# Patient Record
Sex: Male | Born: 2000 | Race: Black or African American | Hispanic: No | Marital: Single | Smoking: Never smoker
Health system: Southern US, Community
[De-identification: ages and names within clinical notes are randomized; demographics above are authoritative.]

## PROBLEM LIST (undated history)

## (undated) DIAGNOSIS — Z789 Other specified health status: Secondary | ICD-10-CM

## (undated) DIAGNOSIS — S02610A Fracture of condylar process of mandible, unspecified side, initial encounter for closed fracture: Secondary | ICD-10-CM

---
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
## 2019-04-23 ENCOUNTER — Other Ambulatory Visit: Payer: Self-pay

## 2019-04-23 ENCOUNTER — Encounter (HOSPITAL_COMMUNITY): Payer: Self-pay

## 2019-04-23 ENCOUNTER — Ambulatory Visit (HOSPITAL_COMMUNITY)
Admission: EM | Admit: 2019-04-23 | Discharge: 2019-04-23 | Disposition: A | Discharge status: Home / Self Care | Payer: Medicaid Other | Attending: Family Medicine | Admitting: Family Medicine

## 2019-04-23 DIAGNOSIS — M79675 Pain in left toe(s): Secondary | ICD-10-CM

## 2019-04-23 MED ORDER — IBUPROFEN 600 MG PO TABS: 600.0000 mg | ORAL_TABLET | Freq: Four times a day (QID) | ORAL | 0 refills | Status: DC | PRN

## 2019-04-23 NOTE — Discharge Instructions (Addendum)
This is most likely a sprain of the toe or just pain from the shoes you are wearing and walking. We will do ibuprofen every 6 hours, 600 mg. Rest, ice and elevate the foot. Wear the shoe for comfort when walking Follow up as needed for continued or worsening symptoms

## 2019-04-23 NOTE — ED Triage Notes (Signed)
Pt states he is having left foot pain and left pinky toe pain x 1 week. Pt thinks is relate to all the hours he is standing up and walking at his job at [employer].

## 2019-04-24 NOTE — ED Provider Notes (Signed)
[facility]    CSN: [id number] Arrival date & time: 04/23/19  1429      History   Chief Complaint Chief Complaint  Patient presents with  . Foot Pain  . Toe Pain    HPI Victor Rogers is a 18 y.o. male.   Patient is an 18 year old male presents today with left fifth toe pain, mild swelling x1 week.  Symptoms have been constant and remain the same.  He reports a lot of walking and standing at work.  He feels as if his shoes are not supportive.  No injuries to the foot.  No redness, increased warmth, fevers or history of gout.  He has not tried anything for his symptoms.  ROS per HPI    Foot Pain  Toe Pain    Past Medical History:  Diagnosis Date  . 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 Medical history non-contributory     There are no active problems to display for this patient.   Past Surgical History:  Procedure Laterality Date  . MANDIBULAR HARDWARE REMOVAL Bilateral 07/13/2017   Procedure: MANDIBULAR HARDWARE REMOVAL;  Surgeon: Peggye Form, DO;  Location: [facility];  Service: Plastics;  Laterality: Bilateral;  . ORIF MANDIBULAR FRACTURE Bilateral 05/25/2017   Procedure: OPEN REDUCTION INTERNAL FIXATION (ORIF) BILATERAL  MANDIBULAR FRACTURE;  Surgeon: Peggye Form, DO;  Location: [facility];  Service: Plastics;  Laterality: Bilateral;       Home Medications    Prior to Admission medications   Medication Sig Start Date End Date Taking? Authorizing Provider  ibuprofen (ADVIL) 600 MG tablet Take 1 tablet (600 mg total) by mouth every 6 (six) hours as needed. 04/23/19   Janace Aris, NP    Family History Family History  Problem Relation Age of Onset  . Hypertension Maternal Grandmother   . Stroke Maternal Grandfather     Social History Social History   Tobacco Use  . Smoking status: Passive Smoke Exposure - Never Smoker  . Smokeless tobacco: Never Used   . Tobacco comment: mother smokes outside  Substance Use Topics  . Alcohol use: No  . Drug use: No     Allergies   Patient has no known allergies.   Review of Systems Review of Systems   Physical Exam Triage Vital Signs ED Triage Vitals  Enc Vitals Group     BP 04/23/19 1509 130/73     Pulse Rate 04/23/19 1509 71     Resp 04/23/19 1509 14     Temp 04/23/19 1509 98.6 F (37 C)     Temp Source 04/23/19 1509 Oral     SpO2 04/23/19 1509 99 %     Weight --      Height --      Head Circumference --      Peak Flow --      Pain Score 04/23/19 1507 9     Pain Loc --      Pain Edu? --      Excl. in GC? --    No data found.  Updated Vital Signs BP 130/73 (BP Location: Right Arm)   Pulse 71   Temp 98.6 F (37 C) (Oral)   Resp 14   SpO2 99%   Visual Acuity Right Eye Distance:   Left Eye Distance:   Bilateral Distance:    Right Eye Near:   Left Eye Near:    Bilateral Near:  Physical Exam Vitals signs and nursing note reviewed.  Constitutional:      Appearance: Normal appearance.  HENT:     Head: Normocephalic and atraumatic.     Nose: Nose normal.  Eyes:     Conjunctiva/sclera: Conjunctivae normal.  Neck:     Musculoskeletal: Normal range of motion.  Pulmonary:     Effort: Pulmonary effort is normal.  Musculoskeletal: Normal range of motion.     Comments: Mild swelling and tenderness to left fifth toe.  No bruising, deformities.  No increased warmth or redness.  Strong pedal pulse  Skin:    General: Skin is warm and dry.  Neurological:     Mental Status: He is alert.  Psychiatric:        Mood and Affect: Mood normal.      UC Treatments / Results  Labs (all labs ordered are listed, but only abnormal results are displayed) Labs Reviewed - No data to display  EKG   Radiology No results found.  Procedures Procedures (including critical care time)  Medications Ordered in UC Medications - No data to display  Initial Impression /  Assessment and Plan / UC Course  I have reviewed the triage vital signs and the nursing notes.  Pertinent labs & imaging results that were available during my care of the patient were reviewed by me and considered in my medical decision making (see chart for details).     Toe pain-most likely from increased walking, standing and nonsupportive shoes. We will have him rest, ice, elevate the foot and take ibuprofen every 6 hours Follow up as needed for continued or worsening symptoms Recommended [place] with supportive shoes. Final Clinical Impressions(s) / UC Diagnoses   Final diagnoses:  Toe pain, left     Discharge Instructions     This is most likely a sprain of the toe or just pain from the shoes you are wearing and walking. We will do ibuprofen every 6 hours, 600 mg. Rest, ice and elevate the foot. Wear the shoe for comfort when walking Follow up as needed for continued or worsening symptoms    ED Prescriptions    Medication Sig Dispense Auth. Provider   ibuprofen (ADVIL) 600 MG tablet Take 1 tablet (600 mg total) by mouth every 6 (six) hours as needed. 30 tablet Loura Halt A, NP     PDMP not reviewed this encounter.   Orvan July, NP 04/24/19 [phone]

## 2020-07-01 ENCOUNTER — Emergency Department (HOSPITAL_COMMUNITY)
Admission: EM | Admit: 2020-07-01 | Discharge: 2020-07-01 | Discharge status: Absent without leave | Payer: Medicaid Other

## 2020-07-15 ENCOUNTER — Other Ambulatory Visit: Payer: Medicaid Other

## 2020-07-16 ENCOUNTER — Other Ambulatory Visit: Payer: Self-pay

## 2020-07-16 DIAGNOSIS — Z20822 Contact with and (suspected) exposure to covid-19: Secondary | ICD-10-CM

## 2020-07-18 LAB — SARS-COV-2, NAA 2 DAY TAT

## 2020-07-18 LAB — NOVEL CORONAVIRUS, NAA: SARS-CoV-2, NAA: NOT DETECTED

## 2020-07-21 ENCOUNTER — Encounter (HOSPITAL_COMMUNITY): Payer: Self-pay

## 2021-01-22 ENCOUNTER — Encounter: Payer: Self-pay | Admitting: Emergency Medicine

## 2021-01-22 ENCOUNTER — Ambulatory Visit
Admission: EM | Admit: 2021-01-22 | Discharge: 2021-01-22 | Disposition: A | Discharge status: Home / Self Care | Payer: Medicaid Other | Attending: Physician Assistant | Admitting: Physician Assistant

## 2021-01-22 DIAGNOSIS — J029 Acute pharyngitis, unspecified: Secondary | ICD-10-CM

## 2021-01-22 DIAGNOSIS — J039 Acute tonsillitis, unspecified: Secondary | ICD-10-CM | POA: Insufficient documentation

## 2021-01-22 LAB — POCT RAPID STREP A (OFFICE): Rapid Strep A Screen: NEGATIVE

## 2021-01-22 LAB — POCT MONO SCREEN (KUC): Mono, POC: NEGATIVE

## 2021-01-22 MED ORDER — CEFTRIAXONE SODIUM 1 G IJ SOLR
1.0000 g | Freq: Once | INTRAMUSCULAR | Status: AC
  Administered 2021-01-22: 1 g via INTRAMUSCULAR

## 2021-01-22 MED ORDER — AMOXICILLIN-POT CLAVULANATE 875-125 MG PO TABS: 1.0000 | ORAL_TABLET | Freq: Two times a day (BID) | ORAL | 0 refills | Status: AC

## 2021-01-22 NOTE — Discharge Instructions (Addendum)
Your strep and mono testing was negative.  We gave you an injection of antibiotics today to help speed up your recovery and I would like you to start Augmentin twice daily for 10 days.  You can use over-the-counter medications for symptom relief such as Tylenol and ibuprofen.  If you have any increased throat swelling, difficulty swallowing, changes in your voice, shortness of breath, high fever you need to be reevaluated in the emergency room.  Please follow-up with either our clinic or your primary care provider within 1 week to ensure symptom improvement.

## 2021-01-22 NOTE — ED Triage Notes (Signed)
Hard to swallow, left tonsil pain, x 3 days. Very swollen on exam, 4/4. Denies right tonsil pain, no swelling visible. Grey exudate on left tonsil

## 2021-01-22 NOTE — ED Provider Notes (Signed)
[facility]    CSN: [id number] Arrival date & time: 01/22/21  1150      History   Chief Complaint Chief Complaint  Patient presents with   Sore Throat    HPI Victor Rogers is a 20 y.o. male.   Patient presents today with a 3-day history of severe sore throat that is worse on the left side.  He reports pain is rated 7 on a 0-10 pain scale, worse with swallowing, no alleviating factors identified.  Denies additional symptoms including fever, cough, congestion, nausea, vomiting, headache, dizziness.  Denies any difficulty swallowing, shortness of breath, muffled voice.  Has not tried any over-the-counter medication for symptom management.  Denies any recent antibiotic use.  Denies any known sick contacts.  He is able to eat and drink despite symptoms.   Past Medical History:  Diagnosis Date   Condylar process of mandible, closed fracture [facility]) 11/5 2018   right   Medical history non-contributory     There are no problems to display for this patient.   Past Surgical History:  Procedure Laterality Date   MANDIBULAR HARDWARE REMOVAL Bilateral 07/13/2017   Procedure: MANDIBULAR HARDWARE REMOVAL;  Surgeon: Peggye Form, DO;  Location: [facility];  Service: Plastics;  Laterality: Bilateral;   ORIF MANDIBULAR FRACTURE Bilateral 05/25/2017   Procedure: OPEN REDUCTION INTERNAL FIXATION (ORIF) BILATERAL  MANDIBULAR FRACTURE;  Surgeon: Peggye Form, DO;  Location: [facility];  Service: Plastics;  Laterality: Bilateral;       Home Medications    Prior to Admission medications   Medication Sig Start Date End Date Taking? Authorizing Provider  amoxicillin-clavulanate (AUGMENTIN) 875-125 MG tablet Take 1 tablet by mouth every 12 (twelve) hours for 10 days. 01/22/21 02/01/21 Yes Rogena Deupree K, PA-C  ibuprofen (ADVIL) 600 MG tablet Take 1 tablet (600 mg total) by mouth every 6 (six) hours as needed. 04/23/19   Janace Aris, NP     Family History Family History  Problem Relation Age of Onset   Hypertension Maternal Grandmother    Stroke Maternal Grandfather     Social History Social History   Tobacco Use   Smoking status: Passive Smoke Exposure - Never Smoker   Smokeless tobacco: Never   Tobacco comments:    mother smokes outside  Vaping Use   Vaping Use: Never used  Substance Use Topics   Alcohol use: No   Drug use: No     Allergies   Patient has no known allergies.   Review of Systems Review of Systems  Constitutional:  Negative for activity change, appetite change, fatigue and fever.  HENT:  Positive for sore throat. Negative for congestion, sinus pressure, sneezing, trouble swallowing and voice change.   Respiratory:  Negative for cough and shortness of breath.   Cardiovascular:  Negative for chest pain.  Gastrointestinal:  Negative for abdominal pain, diarrhea, nausea and vomiting.  Neurological:  Negative for dizziness, light-headedness and headaches.    Physical Exam Triage Vital Signs ED Triage Vitals  Enc Vitals Group     BP 01/22/21 1158 119/61     Pulse Rate 01/22/21 1158 82     Resp 01/22/21 1158 14     Temp 01/22/21 1158 97.9 F (36.6 C)     Temp Source 01/22/21 1158 Oral     SpO2 01/22/21 1158 98 %     Weight --      Height --      Head Circumference --  Peak Flow --      Pain Score 01/22/21 1201 7     Pain Loc --      Pain Edu? --      Excl. in GC? --    No data found.  Updated Vital Signs BP 119/61 (BP Location: Left Arm)   Pulse 82   Temp 97.9 F (36.6 C) (Oral)   Resp 14   SpO2 98%   Visual Acuity Right Eye Distance:   Left Eye Distance:   Bilateral Distance:    Right Eye Near:   Left Eye Near:    Bilateral Near:     Physical Exam Vitals reviewed.  Constitutional:      General: He is awake.     Appearance: Normal appearance. He is normal weight. He is not ill-appearing.     Comments: Very pleasant male appears stated age in no acute  distress  HENT:     Head: Normocephalic and atraumatic.     Right Ear: External ear normal.     Left Ear: External ear normal.     Nose: Nose normal.     Mouth/Throat:     Pharynx: Uvula midline. Posterior oropharyngeal erythema present. No oropharyngeal exudate.     Tonsils: Tonsillar exudate present. No tonsillar abscesses. 2+ on the right. 2+ on the left.  Cardiovascular:     Rate and Rhythm: Normal rate and regular rhythm.     Heart sounds: Normal heart sounds, S1 normal and S2 normal. No murmur heard. Pulmonary:     Effort: Pulmonary effort is normal. No accessory muscle usage or respiratory distress.     Breath sounds: Normal breath sounds. No stridor. No wheezing, rhonchi or rales.     Comments: Clear to auscultation bilaterally Abdominal:     General: Bowel sounds are normal.     Palpations: Abdomen is soft.     Tenderness: There is no abdominal tenderness.  Lymphadenopathy:     Head:     Right side of head: No submental, submandibular or tonsillar adenopathy.     Left side of head: No submental, submandibular or tonsillar adenopathy.     Cervical: No cervical adenopathy.  Neurological:     Mental Status: He is alert.  Psychiatric:        Behavior: Behavior is cooperative.     UC Treatments / Results  Labs (all labs ordered are listed, but only abnormal results are displayed) Labs Reviewed  POCT RAPID STREP A (OFFICE) - Normal  CULTURE, GROUP A STREP [facility])  POCT MONO SCREEN [facility])    EKG   Radiology No results found.  Procedures Procedures (including critical care time)  Medications Ordered in UC Medications  cefTRIAXone (ROCEPHIN) injection 1 g (has no administration in time range)    Initial Impression / Assessment and Plan / UC Course  I have reviewed the triage vital signs and the nursing notes.  Pertinent labs & imaging results that were available during my care of the patient were reviewed by me and considered in my medical decision making (see  chart for details).      Vital signs and physical exam reassuring today; no indication for emergent evaluation.  Concern for beginning of peritonsillar abscess given clinical presentation.  Patient was given 1 g of Rocephin in clinic and started on Augmentin outpatient.  Discussed that if he has any worsening symptoms including fever, difficulty swallowing, difficulty speaking, shortness of breath he needs to go to the emergency room. Recommended he follow-up with  ear nose and throat doctor for further evaluation and management was given contact information for local group.  Discussed at length alarm symptoms that warrant emergent evaluation to which patient expressed understanding.  Strict return precautions given to which patient expressed understanding.  Final Clinical Impressions(s) / UC Diagnoses   Final diagnoses:  Acute tonsillitis, unspecified etiology  Sore throat     Discharge Instructions      Your strep and mono testing was negative.  We gave you an injection of antibiotics today to help speed up your recovery and I would like you to start Augmentin twice daily for 10 days.  You can use over-the-counter medications for symptom relief such as Tylenol and ibuprofen.  If you have any increased throat swelling, difficulty swallowing, changes in your voice, shortness of breath, high fever you need to be reevaluated in the emergency room.  Please follow-up with either our clinic or your primary care provider within 1 week to ensure symptom improvement.     ED Prescriptions     Medication Sig Dispense Auth. Provider   amoxicillin-clavulanate (AUGMENTIN) 875-125 MG tablet Take 1 tablet by mouth every 12 (twelve) hours for 10 days. 20 tablet Ixchel Duck, Noberto Retort, PA-C      PDMP not reviewed this encounter.   Jeani Hawking, PA-C 01/22/21 1301

## 2021-01-25 LAB — CULTURE, GROUP A STREP (THRC)

## 2021-05-08 ENCOUNTER — Ambulatory Visit
Admission: EM | Admit: 2021-05-08 | Discharge: 2021-05-08 | Disposition: A | Discharge status: Home / Self Care | Payer: Medicaid Other | Attending: Emergency Medicine | Admitting: Emergency Medicine

## 2021-05-08 ENCOUNTER — Other Ambulatory Visit: Payer: Self-pay

## 2021-05-08 DIAGNOSIS — J101 Influenza due to other identified influenza virus with other respiratory manifestations: Secondary | ICD-10-CM | POA: Diagnosis not present

## 2021-05-08 DIAGNOSIS — B349 Viral infection, unspecified: Secondary | ICD-10-CM

## 2021-05-08 LAB — POCT INFLUENZA A/B
Influenza A, POC: POSITIVE — AB
Influenza B, POC: NEGATIVE

## 2021-05-08 MED ORDER — IBUPROFEN 600 MG PO TABS
600.0000 mg | ORAL_TABLET | Freq: Four times a day (QID) | ORAL | 0 refills | Status: AC | PRN
Start: 2021-05-08 — End: ?

## 2021-05-08 NOTE — ED Provider Notes (Signed)
[facility]    CSN: [id number] Arrival date & time: 05/08/21  1112      History   Chief Complaint No chief complaint on file.   HPI Victor Rogers is a 20 y.o. male.   Patient presents to the urgent care today complaining of fever, chills, sore throat and headache, patient is requesting testing for COVID and flu today.  Patient states his symptoms started began yesterday.  Patient states has been taking ibuprofen for his symptoms with some relief.  Vital signs are normal on arrival today.  From observation, patient does not appear ill.  Patient states that he had his influenza vaccine 4 weeks ago.  The history is provided by the patient.   Past Medical History:  Diagnosis Date   Condylar process of mandible, closed fracture [facility]) 11/5 2018   right   Medical history non-contributory     There are no problems to display for this patient.   Past Surgical History:  Procedure Laterality Date   MANDIBULAR HARDWARE REMOVAL Bilateral 07/13/2017   Procedure: MANDIBULAR HARDWARE REMOVAL;  Surgeon: Peggye Form, DO;  Location: [facility];  Service: Plastics;  Laterality: Bilateral;   ORIF MANDIBULAR FRACTURE Bilateral 05/25/2017   Procedure: OPEN REDUCTION INTERNAL FIXATION (ORIF) BILATERAL  MANDIBULAR FRACTURE;  Surgeon: Peggye Form, DO;  Location: [facility];  Service: Plastics;  Laterality: Bilateral;       Home Medications    Prior to Admission medications   Medication Sig Start Date End Date Taking? Authorizing Provider  ibuprofen (ADVIL) 600 MG tablet Take 1 tablet (600 mg total) by mouth every 6 (six) hours as needed. 05/08/21   Theadora Rama Scales, PA-C    Family History Family History  Problem Relation Age of Onset   Hypertension Maternal Grandmother    Stroke Maternal Grandfather     Social History Social History   Tobacco Use   Smoking status: Passive Smoke Exposure - Never Smoker   Smokeless  tobacco: Never   Tobacco comments:    mother smokes outside  Vaping Use   Vaping Use: Never used  Substance Use Topics   Alcohol use: No   Drug use: No     Allergies   Patient has no known allergies.   Review of Systems Review of Systems Pertinent findings noted in history of present illness.    Physical Exam Triage Vital Signs ED Triage Vitals  Enc Vitals Group     BP 05/01/21 0827 (!) 147/82     Pulse Rate 05/01/21 0827 72     Resp 05/01/21 0827 18     Temp 05/01/21 0827 98.3 F (36.8 C)     Temp Source 05/01/21 0827 Oral     SpO2 05/01/21 0827 98 %     Weight --      Height --      Head Circumference --      Peak Flow --      Pain Score 05/01/21 0826 5     Pain Loc --      Pain Edu? --      Excl. in GC? --    No data found.  Updated Vital Signs BP 131/87 (BP Location: Right Arm)   Pulse 67   Temp 98.4 F (36.9 C) (Oral)   Resp 20   SpO2 98%   Visual Acuity Right Eye Distance:   Left Eye Distance:   Bilateral Distance:    Right Eye Near:   Left  Eye Near:    Bilateral Near:     Physical Exam Vitals and nursing note reviewed.  Constitutional:      General: He is not in acute distress.    Appearance: Normal appearance. He is not ill-appearing.  HENT:     Head: Normocephalic and atraumatic.     Salivary Glands: Right salivary gland is not diffusely enlarged or tender. Left salivary gland is not diffusely enlarged or tender.     Right Ear: Tympanic membrane, ear canal and external ear normal. No drainage. No middle ear effusion. There is no impacted cerumen. Tympanic membrane is not erythematous or bulging.     Left Ear: Tympanic membrane, ear canal and external ear normal. No drainage.  No middle ear effusion. There is no impacted cerumen. Tympanic membrane is not erythematous or bulging.     Nose: Nose normal. No nasal deformity, septal deviation, mucosal edema, congestion or rhinorrhea.     Right Turbinates: Not enlarged, swollen or pale.      Left Turbinates: Not enlarged, swollen or pale.     Right Sinus: No maxillary sinus tenderness or frontal sinus tenderness.     Left Sinus: No maxillary sinus tenderness or frontal sinus tenderness.     Mouth/Throat:     Lips: Pink. No lesions.     Mouth: Mucous membranes are moist. No oral lesions.     Pharynx: Oropharynx is clear. Uvula midline. No posterior oropharyngeal erythema or uvula swelling.     Tonsils: No tonsillar exudate. 0 on the right. 0 on the left.  Eyes:     General: Lids are normal.        Right eye: No discharge.        Left eye: No discharge.     Extraocular Movements: Extraocular movements intact.     Conjunctiva/sclera: Conjunctivae normal.     Right eye: Right conjunctiva is not injected.     Left eye: Left conjunctiva is not injected.  Neck:     Trachea: Trachea and phonation normal.  Cardiovascular:     Rate and Rhythm: Normal rate and regular rhythm.     Pulses: Normal pulses.     Heart sounds: Normal heart sounds. No murmur heard.   No friction rub. No gallop.  Pulmonary:     Effort: Pulmonary effort is normal. No accessory muscle usage, prolonged expiration or respiratory distress.     Breath sounds: Normal breath sounds. No stridor, decreased air movement or transmitted upper airway sounds. No decreased breath sounds, wheezing, rhonchi or rales.  Chest:     Chest wall: No tenderness.  Musculoskeletal:        General: Normal range of motion.     Cervical back: Normal range of motion and neck supple. Normal range of motion.  Lymphadenopathy:     Cervical: No cervical adenopathy.  Skin:    General: Skin is warm and dry.     Findings: No erythema or rash.  Neurological:     General: No focal deficit present.     Mental Status: He is alert and oriented to person, place, and time.  Psychiatric:        Mood and Affect: Mood normal.        Behavior: Behavior normal.     UC Treatments / Results  Labs (all labs ordered are listed, but only abnormal  results are displayed) Labs Reviewed  POCT INFLUENZA A/B - Abnormal; Notable for the following components:      Result Value  Influenza A, POC Positive (*)    All other components within normal limits    EKG   Radiology No results found.  Procedures Procedures (including critical care time)  Medications Ordered in UC Medications - No data to display  Initial Impression / Assessment and Plan / UC Course  I have reviewed the triage vital signs and the nursing notes.  Pertinent labs & imaging results that were available during my care of the patient were reviewed by me and considered in my medical decision making (see chart for details).     Physical exam today is unremarkable, patient is well-appearing.  Patient tested positive for influenza A, conservative care recommended, return precautions advised.  Patient verbalized understanding and agreement of plan as discussed.  All questions were addressed during visit.  Please see discharge instructions below for further details of plan.  Final Clinical Impressions(s) / UC Diagnoses   Final diagnoses:  Viral illness  Influenza A     Discharge Instructions      Your influenza test today was positive for influenza A.  Testing for COVID-19 is not indicated.  Conservative care is recommended at this time.  This includes rest, pushing clear fluids and activity as tolerated.  You may also noticed that your appetite is reduced, this is okay as long as you are drinking plenty of clear fluids.  Acetaminophen (Tylenol): This is a good fever reducer.  If your body temperature rises above 101.5 as measured with a thermometer, it is recommended that you take 1,000 mg every 6-8 hours until your temperature falls below 101.5, please not take more than 3,000 mg of acetaminophen either as a separate medication or as in ingredient in an over-the-counter cold/flu preparation within a 24-hour period  Ibuprofen  (Advil, Motrin): This is a good  anti-inflammatory medication which addresses aches and pains and, to some degree, congestion in the nasal passages.  I recommend taking between 400 to 600 mg every 6-8 hours as needed.  Pseudoephedrine (Sudafed): This is a decongestant.  This medication has to be purchased from the pharmacist counter, I recommend taking 2 tablets, 60 mg, 2-3 times a day as needed to relieve runny nose and sinus drainage.  Guaifenesin (Robitussin, Mucinex): This is an expectorant.  This helps break up chest congestion and loosen up thick nasal drainage making phlegm and drainage more liquid and therefore easier to remove.  I recommend taking 400 mg three times daily as needed.  Dextromethorphan (any cough medicine with the letters "DM" added to it's name such as Robitussin DM): This is a cough suppressant.  This is often recommended to be taken at nighttime to suppress cough and help people sleep.  Take as directed on the bottle.   Based on my physical exam findings and the history you provided  today, I do not see any evidence of bacterial infection therefore treatment with antibiotics would be of no benefit.      ED Prescriptions     Medication Sig Dispense Auth. Provider   ibuprofen (ADVIL) 600 MG tablet Take 1 tablet (600 mg total) by mouth every 6 (six) hours as needed. 30 tablet Theadora Rama Scales, PA-C      PDMP not reviewed this encounter.    Theadora Rama Scales, [place] 05/08/21 1232

## 2021-05-08 NOTE — ED Triage Notes (Signed)
Pt reports having fever, chills, sore throat, and headache. Patient requesting COVID and flu testing.  Started: yesterday

## 2021-05-08 NOTE — Discharge Instructions (Addendum)
Your influenza test today was positive for influenza A.  Testing for COVID-19 is not indicated.  Conservative care is recommended at this time.  This includes rest, pushing clear fluids and activity as tolerated.  You may also noticed that your appetite is reduced, this is okay as long as you are drinking plenty of clear fluids.  Acetaminophen (Tylenol): This is a good fever reducer.  If your body temperature rises above 101.5 as measured with a thermometer, it is recommended that you take 1,000 mg every 6-8 hours until your temperature falls below 101.5, please not take more than 3,000 mg of acetaminophen either as a separate medication or as in ingredient in an over-the-counter cold/flu preparation within a 24-hour period  Ibuprofen  (Advil, Motrin): This is a good anti-inflammatory medication which addresses aches and pains and, to some degree, congestion in the nasal passages.  I recommend taking between 400 to 600 mg every 6-8 hours as needed.  Pseudoephedrine (Sudafed): This is a decongestant.  This medication has to be purchased from the pharmacist counter, I recommend taking 2 tablets, 60 mg, 2-3 times a day as needed to relieve runny nose and sinus drainage.  Guaifenesin (Robitussin, Mucinex): This is an expectorant.  This helps break up chest congestion and loosen up thick nasal drainage making phlegm and drainage more liquid and therefore easier to remove.  I recommend taking 400 mg three times daily as needed.  Dextromethorphan (any cough medicine with the letters "DM" added to it's name such as Robitussin DM): This is a cough suppressant.  This is often recommended to be taken at nighttime to suppress cough and help people sleep.  Take as directed on the bottle.   Based on my physical exam findings and the history you provided  today, I do not see any evidence of bacterial infection therefore treatment with antibiotics would be of no benefit.

## 2021-11-09 ENCOUNTER — Ambulatory Visit (HOSPITAL_COMMUNITY)
Admission: EM | Admit: 2021-11-09 | Discharge: 2021-11-09 | Disposition: A | Discharge status: Home / Self Care | Payer: Medicaid Other | Attending: Internal Medicine | Admitting: Internal Medicine

## 2021-11-09 ENCOUNTER — Encounter (HOSPITAL_COMMUNITY): Payer: Self-pay | Admitting: Emergency Medicine

## 2021-11-09 DIAGNOSIS — J039 Acute tonsillitis, unspecified: Secondary | ICD-10-CM | POA: Insufficient documentation

## 2021-11-09 LAB — POCT INFECTIOUS MONO SCREEN, ED / UC: Mono Screen: NEGATIVE

## 2021-11-09 LAB — POCT RAPID STREP A, ED / UC: Streptococcus, Group A Screen (Direct): NEGATIVE

## 2021-11-09 MED ORDER — CEFTRIAXONE SODIUM 500 MG IJ SOLR
500.0000 mg | Freq: Once | INTRAMUSCULAR | Status: AC
  Administered 2021-11-09: 500 mg via INTRAMUSCULAR

## 2021-11-09 MED ORDER — CHLORHEXIDINE GLUCONATE 0.12 % MT SOLN: 15.0000 mL | Freq: Two times a day (BID) | OROMUCOSAL | 0 refills | Status: AC

## 2021-11-09 MED ORDER — CEFTRIAXONE SODIUM 500 MG IJ SOLR
INTRAMUSCULAR | Status: AC
  Filled 2021-11-09: qty 500

## 2021-11-09 NOTE — ED Triage Notes (Signed)
Sore throat x 2 days. Oral lesion x 1 week.  ?

## 2021-11-09 NOTE — Discharge Instructions (Addendum)
Warm salt water gargle ?Chlorhexidine mouthwash to gargle as needed ?We will call you with recommendations if labs are abnormal ? ?

## 2021-11-09 NOTE — ED Provider Notes (Signed)
?[facility] ? ? ? ?CSN: [id number] ?Arrival date & time: 11/09/21  1333 ? ? ?  ? ?History   ?Chief Complaint ?Chief Complaint  ?Patient presents with  ? Sore Throat  ? Mouth Lesions  ? ? ?HPI ?Victor Rogers is a 21 y.o. male comes to the urgent care with 2-day history of sore throat and a 1 week history of an oral ulcer.  Patient says symptoms started a week ago with the oral lesion.  The lesion is painful.  No runny nose or nasal congestion.  He started experiencing sore throat 2 days ago.  Sore throat is severe and aggravated by swallowing.  No nausea or vomiting.  No neck swelling or pain.  No abdominal pain.Patient engages in anal receptive intercourse with a male partner. He also endorses oral sex occasionally.  No penile discharge or testicular pain.  No groin pain or swelling.  No rash..  ? ?HPI ? ?Past Medical History:  ?Diagnosis Date  ? 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 right  ? Medical history non-contributory   ? ? ?There are no problems to display for this patient. ? ? ?Past Surgical History:  ?Procedure Laterality Date  ? MANDIBULAR HARDWARE REMOVAL Bilateral 07/13/2017  ? Procedure: MANDIBULAR HARDWARE REMOVAL;  Surgeon: Peggye Form, DO;  Location: [facility];  Service: Plastics;  Laterality: Bilateral;  ? ORIF MANDIBULAR FRACTURE Bilateral 05/25/2017  ? Procedure: OPEN REDUCTION INTERNAL FIXATION (ORIF) BILATERAL  MANDIBULAR FRACTURE;  Surgeon: Peggye Form, DO;  Location: [facility];  Service: Plastics;  Laterality: Bilateral;  ? ? ? ? ? ?Home Medications   ? ?Prior to Admission medications   ?Medication Sig Start Date End Date Taking? Authorizing Provider  ?chlorhexidine (PERIDEX) 0.12 % solution Use as directed 15 mLs in the mouth or throat 2 (two) times daily. 11/09/21  Yes Kailash Hinze, Britta Mccreedy, MD  ?ibuprofen (ADVIL) 600 MG tablet Take 1 tablet (600 mg total) by mouth every 6 (six) hours as needed. 05/08/21   Theadora Rama Scales, PA-C  ? ? ?Family History ?Family History  ?Problem Relation Age of Onset  ? Hypertension Maternal Grandmother   ? Stroke Maternal Grandfather   ? ? ?Social History ?Social History  ? ?Tobacco Use  ? Smoking status: Never  ?  Passive exposure: Yes  ? Smokeless tobacco: Never  ? Tobacco comments:  ?  mother smokes outside  ?Vaping Use  ? Vaping Use: Never used  ?Substance Use Topics  ? Alcohol use: No  ? Drug use: No  ? ? ? ?Allergies   ?Patient has no known allergies. ? ? ?Review of Systems ?Review of Systems ?As per HPI ? ?Physical Exam ?Triage Vital Signs ?ED Triage Vitals  ?Enc Vitals Group  ?   BP 11/09/21 1556 124/78  ?   Pulse Rate 11/09/21 1556 82  ?   Resp 11/09/21 1556 16  ?   Temp 11/09/21 1556 98.5 ?F (36.9 ?C)  ?   Temp Source 11/09/21 1556 Oral  ?   SpO2 11/09/21 1556 98 %  ?   Weight --   ?   Height --   ?   Head Circumference --   ?   Peak Flow --   ?   Pain Score 11/09/21 1558 10  ?   Pain Loc --   ?   Pain Edu? --   ?   Excl. in GC? --   ? ?No  data found. ? ?Updated Vital Signs ?BP 124/78 (BP Location: Left Arm)   Pulse 82   Temp 98.5 ?F (36.9 ?C) (Oral)   Resp 16   SpO2 98%  ? ?Visual Acuity ?Right Eye Distance:   ?Left Eye Distance:   ?Bilateral Distance:   ? ?Right Eye Near:   ?Left Eye Near:    ?Bilateral Near:    ? ?Physical Exam ?Vitals and nursing note reviewed.  ?Constitutional:   ?   General: He is not in acute distress. ?   Appearance: He is not ill-appearing.  ?HENT:  ?   Right Ear: Tympanic membrane normal.  ?   Left Ear: Tympanic membrane normal.  ?   Mouth/Throat:  ?   Tonsils: Tonsillar exudate present. 2+ on the right. 2+ on the left.  ?Eyes:  ?   Conjunctiva/sclera: Conjunctivae normal.  ?   Pupils: Pupils are equal, round, and reactive to light.  ?Cardiovascular:  ?   Rate and Rhythm: Normal rate and regular rhythm.  ?Pulmonary:  ?   Effort: Pulmonary effort is normal.  ?   Breath sounds: Normal breath sounds.  ?Musculoskeletal:  ?   Cervical back: Normal range  of motion.  ?Lymphadenopathy:  ?   Cervical: No cervical adenopathy.  ?Neurological:  ?   Mental Status: He is alert.  ? ? ? ?UC Treatments / Results  ?Labs ?(all labs ordered are listed, but only abnormal results are displayed) ?Labs Reviewed  ?POCT RAPID STREP A, ED / UC  ?POCT INFECTIOUS MONO SCREEN, ED / UC  ?CYTOLOGY, (ORAL, ANAL, URETHRAL) ANCILLARY ONLY  ? ? ?EKG ? ? ?Radiology ?No results found. ? ?Procedures ?Procedures (including critical care time) ? ?Medications Ordered in UC ?Medications - No data to display ? ?Initial Impression / Assessment and Plan / UC Course  ?I have reviewed the triage vital signs and the nursing notes. ? ?Pertinent labs & imaging results that were available during my care of the patient were reviewed by me and considered in my medical decision making (see chart for details). ? ?  ? ?1.  Acute exudative tonsillitis: ?POCT strep a test is negative ?Mono spot test is negative ?Cytology for GC/chlamydia-sample taken from the throat ?Ceftriaxone 500 mg IM x1 dose ?We will call patient with recommendations if labs are abnormal. ?Final Clinical Impressions(s) / UC Diagnoses  ? ?Final diagnoses:  ?Acute tonsillitis, unspecified etiology  ? ? ? ?Discharge Instructions   ? ?  ?Warm salt water gargle ?Chlorhexidine mouthwash to gargle as needed ?We will call you with recommendations if labs are abnormal ? ? ? ? ? ?ED Prescriptions   ? ? Medication Sig Dispense Auth. Provider  ? chlorhexidine (PERIDEX) 0.12 % solution Use as directed 15 mLs in the mouth or throat 2 (two) times daily. 120 mL Jmichael Gille, Britta Mccreedy, MD  ? ?  ? ?PDMP not reviewed this encounter. ?  ?Merrilee Jansky, MD ?11/09/21 1658 ? ?

## 2021-11-10 ENCOUNTER — Telehealth (HOSPITAL_COMMUNITY): Payer: Self-pay | Admitting: Emergency Medicine

## 2021-11-10 LAB — CYTOLOGY, (ORAL, ANAL, URETHRAL) ANCILLARY ONLY
Chlamydia: POSITIVE — AB
Comment: NEGATIVE
Comment: NORMAL
Neisseria Gonorrhea: POSITIVE — AB

## 2021-11-10 MED ORDER — DOXYCYCLINE HYCLATE 100 MG PO CAPS: 100.0000 mg | ORAL_CAPSULE | Freq: Two times a day (BID) | ORAL | 0 refills | Status: AC

## 2021-11-12 LAB — CULTURE, GROUP A STREP (THRC)

## 2023-04-04 ENCOUNTER — Ambulatory Visit: Payer: Medicaid Other

## 2023-04-04 ENCOUNTER — Ambulatory Visit (HOSPITAL_COMMUNITY)
Admission: EM | Admit: 2023-04-04 | Discharge: 2023-04-04 | Disposition: A | Discharge status: Home / Self Care | Payer: Medicaid Other | Attending: Family Medicine | Admitting: Family Medicine

## 2023-04-04 ENCOUNTER — Encounter (HOSPITAL_COMMUNITY): Payer: Self-pay | Admitting: Emergency Medicine

## 2023-04-04 DIAGNOSIS — R899 Unspecified abnormal finding in specimens from other organs, systems and tissues: Secondary | ICD-10-CM | POA: Diagnosis present

## 2023-04-04 DIAGNOSIS — A530 Latent syphilis, unspecified as early or late: Secondary | ICD-10-CM | POA: Diagnosis present

## 2023-04-04 MED ORDER — PENICILLIN G BENZATHINE 1200000 UNIT/2ML IM SUSY
2.4000 10*6.[IU] | PREFILLED_SYRINGE | Freq: Once | INTRAMUSCULAR | Status: AC
  Administered 2023-04-04: 2.4 10*6.[IU] via INTRAMUSCULAR

## 2023-04-04 MED ORDER — PENICILLIN G BENZATHINE 1200000 UNIT/2ML IM SUSY
PREFILLED_SYRINGE | INTRAMUSCULAR | Status: AC
  Filled 2023-04-04: qty 4

## 2023-04-04 NOTE — Discharge Instructions (Signed)
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  We have drawn blood to check your RPR which is a test for syphilis.  He will give [place] a titer or concentration and then will also give [place] test positive or negative for the syphilis germ.

## 2023-04-04 NOTE — ED Triage Notes (Signed)
Pt presents to UC for syphillis treatment. States she was tested at [facility] on 9/27 and results were positive. Denies any symptoms.

## 2023-04-04 NOTE — ED Provider Notes (Signed)
[facility]    CSN: [id number] Arrival date & time: 04/04/23  1116      History   Chief Complaint Chief Complaint  Patient presents with   Exposure to STD    HPI Zabdiel Dripps is a 22 y.o. male.    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  There is some confusion at first, but apparently she has never had testing on blood for syphilis in the past.  She shows me her portal app for [facility] and it just says it is abnormal, and does not give me a titer, and nor does it say she has had a TPA performed.  No allergies to medications  No rash and no other signs of illness.     Past Medical History:  Diagnosis Date   Condylar process of mandible, closed fracture [facility]) 11/5 2018   right   Medical history non-contributory     There are no problems to display for this patient.   Past Surgical History:  Procedure Laterality Date   MANDIBULAR HARDWARE REMOVAL Bilateral 07/13/2017   Procedure: MANDIBULAR HARDWARE REMOVAL;  Surgeon: Peggye Form, DO;  Location: [facility];  Service: Plastics;  Laterality: Bilateral;   ORIF MANDIBULAR FRACTURE Bilateral 05/25/2017   Procedure: OPEN REDUCTION INTERNAL FIXATION (ORIF) BILATERAL  MANDIBULAR FRACTURE;  Surgeon: Peggye Form, DO;  Location: [facility];  Service: Plastics;  Laterality: Bilateral;       Home Medications    Prior to Admission medications   Medication Sig Start Date End Date Taking? Authorizing Provider  chlorhexidine (PERIDEX) 0.12 % solution Use as directed 15 mLs in the mouth or throat 2 (two) times daily. 11/09/21   Lamptey, Britta Mccreedy, MD  ibuprofen (ADVIL) 600 MG tablet Take 1 tablet (600 mg total) by mouth every 6 (six) hours as needed. 05/08/21   Theadora Rama Scales, PA-C    Family History Family History  Problem Relation Age of Onset   Hypertension Maternal Grandmother    Stroke Maternal Grandfather      Social History Social History   Tobacco Use   Smoking status: Never    Passive exposure: Yes   Smokeless tobacco: Never   Tobacco comments:    mother smokes outside  Vaping Use   Vaping status: Never Used  Substance Use Topics   Alcohol use: No   Drug use: No     Allergies   Patient has no known allergies.   Review of Systems Review of Systems   Physical Exam Triage Vital Signs ED Triage Vitals  Encounter Vitals Group     BP 04/04/23 1140 127/69     Systolic BP Percentile --      Diastolic BP Percentile --      Pulse Rate 04/04/23 1140 100     Resp 04/04/23 1140 18     Temp 04/04/23 1140 98.3 F (36.8 C)     Temp Source 04/04/23 1140 Oral     SpO2 04/04/23 1140 97 %     Weight --      Height --      Head Circumference --      Peak Flow --      Pain Score 04/04/23 1141 0     Pain Loc --      Pain Education --      Exclude from Growth Chart --    No data found.  Updated Vital Signs BP  127/69 (BP Location: Right Arm)   Pulse 100   Temp 98.3 F (36.8 C) (Oral)   Resp 18   SpO2 97%   Visual Acuity Right Eye Distance:   Left Eye Distance:   Bilateral Distance:    Right Eye Near:   Left Eye Near:    Bilateral Near:     Physical Exam Vitals reviewed.  Constitutional:      General: He is not in acute distress.    Appearance: He is not ill-appearing, toxic-appearing or diaphoretic.  Skin:    Coloration: Skin is not jaundiced or pale.     Findings: No rash.  Neurological:     Mental Status: He is alert and oriented to person, place, and time.  Psychiatric:        Behavior: Behavior normal.      UC Treatments / Results  Labs (all labs ordered are listed, but only abnormal results are displayed) Labs Reviewed - No data to display  EKG   Radiology No results found.  Procedures Procedures (including critical care time)  Medications Ordered in UC Medications - No data to display  Initial Impression / Assessment and Plan / UC  Course  I have reviewed the triage vital signs and the nursing notes.  Pertinent labs & imaging results that were available during my care of the patient were reviewed by me and considered in my medical decision making (see chart for details).        RPR is drawn here, as we will get a titer in the tPA reflex done with our lab.  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  I have discussed with the patient that this still could be a false positive, though it is difficult to tell with the results narrative that she has received from [facility]. We discussed retesting in about 3 to 6 months.  Final Clinical Impressions(s) / UC Diagnoses   Final diagnoses:  None   Discharge Instructions   None    ED Prescriptions   None    PDMP not reviewed this encounter.   Zenia Resides, MD 04/04/23 1230

## 2023-04-05 LAB — RPR: RPR Ser Ql: NONREACTIVE

## 2023-07-05 ENCOUNTER — Encounter (HOSPITAL_COMMUNITY): Payer: Self-pay

## 2023-07-05 ENCOUNTER — Ambulatory Visit (HOSPITAL_COMMUNITY)
Admission: EM | Admit: 2023-07-05 | Discharge: 2023-07-05 | Disposition: A | Discharge status: Home / Self Care | Payer: Medicaid Other | Attending: Family Medicine | Admitting: Family Medicine

## 2023-07-05 DIAGNOSIS — R519 Headache, unspecified: Secondary | ICD-10-CM

## 2023-07-05 MED ORDER — SUMATRIPTAN SUCCINATE 6 MG/0.5ML ~~LOC~~ SOLN
SUBCUTANEOUS | Status: AC
  Filled 2023-07-05: qty 0.5

## 2023-07-05 MED ORDER — KETOROLAC TROMETHAMINE 30 MG/ML IJ SOLN
INTRAMUSCULAR | Status: AC
  Filled 2023-07-05: qty 1

## 2023-07-05 MED ORDER — KETOROLAC TROMETHAMINE 30 MG/ML IJ SOLN
30.0000 mg | Freq: Once | INTRAMUSCULAR | Status: AC
  Administered 2023-07-05: 30 mg via INTRAMUSCULAR

## 2023-07-05 MED ORDER — SUMATRIPTAN SUCCINATE 6 MG/0.5ML ~~LOC~~ SOLN
6.0000 mg | Freq: Once | SUBCUTANEOUS | Status: AC
  Administered 2023-07-05: 6 mg via SUBCUTANEOUS

## 2023-07-05 NOTE — Discharge Instructions (Signed)
 You have been given a shot of Toradol 30 mg and of sumatriptan hand 6 mg today.   If you do not improve at all with the treatments provided or if you worsen, please go to the emergency room

## 2023-07-05 NOTE — ED Provider Notes (Signed)
 [facility]    CSN: [id number] Arrival date & time: 07/05/23  1830      History   Chief Complaint Chief Complaint  Patient presents with   Headache    HPI Victor Rogers is a 22 y.o. male.    Headache  Here for throbbing headache that has been bothering them for about 2 days.  No fever or cough or congestion.  No vomiting or diarrhea.  No neck pain.  No trauma or fall  Is hurting all around the head.  There is some photophobia.  No nausea. No known history of migraines      Past Medical History:  Diagnosis Date   Condylar process of mandible, closed fracture [facility]) 11/5 2018   right   Medical history non-contributory     There are no active problems to display for this patient.   Past Surgical History:  Procedure Laterality Date   MANDIBULAR HARDWARE REMOVAL Bilateral 07/13/2017   Procedure: MANDIBULAR HARDWARE REMOVAL;  Surgeon: Lowery Estefana RAMAN, DO;  Location: [facility];  Service: Plastics;  Laterality: Bilateral;   ORIF MANDIBULAR FRACTURE Bilateral 05/25/2017   Procedure: OPEN REDUCTION INTERNAL FIXATION (ORIF) BILATERAL  MANDIBULAR FRACTURE;  Surgeon: Lowery Estefana RAMAN, DO;  [facility];  Service: Plastics;  Laterality: Bilateral;       Home Medications    Prior to Admission medications   Medication Sig Start Date End Date Taking? Authorizing Provider  estradiol valerate (DELESTROGEN) 40 MG/ML injection  06/23/23  Yes [provider]  spironolactone (ALDACTONE) 100 MG tablet Take 100 mg by mouth 2 (two) times daily. 06/23/23  Yes [provider]  chlorhexidine  (PERIDEX ) 0.12 % solution Use as directed 15 mLs in the mouth or throat 2 (two) times daily. 11/09/21   LampteyAleene KIDD, MD  ibuprofen  (ADVIL ) 600 MG tablet Take 1 tablet (600 mg total) by mouth every 6 (six) hours as needed. 05/08/21   Joesph Shaver Scales, PA-C    Family History Family History  Problem Relation Age  of Onset   Hypertension Maternal Grandmother    Stroke Maternal Grandfather     Social History Social History   Tobacco Use   Smoking status: Never    Passive exposure: Yes   Smokeless tobacco: Never   Tobacco comments:    mother smokes outside  Vaping Use   Vaping status: Never Used  Substance Use Topics   Alcohol use: No   Drug use: No     Allergies   Patient has no known allergies.   Review of Systems Review of Systems  Neurological:  Positive for headaches.     Physical Exam Triage Vital Signs ED Triage Vitals  Encounter Vitals Group     BP 07/05/23 1942 115/73     Systolic BP Percentile --      Diastolic BP Percentile --      Pulse Rate 07/05/23 1942 68     Resp 07/05/23 1942 16     Temp 07/05/23 1942 98.8 F (37.1 C)     Temp Source 07/05/23 1942 Oral     SpO2 07/05/23 1942 96 %     Weight --      Height --      Head Circumference --      Peak Flow --      Pain Score 07/05/23 1941 8     Pain Loc --      Pain Education --      Exclude  from Growth Chart --    No data found.  Updated Vital Signs BP 115/73 (BP Location: Right Arm)   Pulse 68   Temp 98.8 F (37.1 C) (Oral)   Resp 16   SpO2 96%   Visual Acuity Right Eye Distance:   Left Eye Distance:   Bilateral Distance:    Right Eye Near:   Left Eye Near:    Bilateral Near:     Physical Exam Vitals reviewed.  Constitutional:      General: He is not in acute distress.    Appearance: He is not toxic-appearing.  HENT:     Nose: Nose normal.     Mouth/Throat:     Mouth: Mucous membranes are moist.     Pharynx: No oropharyngeal exudate or posterior oropharyngeal erythema.  Eyes:     Extraocular Movements: Extraocular movements intact.     Conjunctiva/sclera: Conjunctivae normal.     Pupils: Pupils are equal, round, and reactive to light.  Cardiovascular:     Rate and Rhythm: Normal rate and regular rhythm.     Heart sounds: No murmur heard. Pulmonary:     Effort: Pulmonary effort  is normal.     Breath sounds: Normal breath sounds.  Musculoskeletal:     Cervical back: Neck supple.  Lymphadenopathy:     Cervical: No cervical adenopathy.  Skin:    Capillary Refill: Capillary refill takes less than 2 seconds.     Coloration: Skin is not jaundiced or pale.  Neurological:     General: No focal deficit present.     Mental Status: He is alert and oriented to person, place, and time.     Cranial Nerves: No cranial nerve deficit.     Sensory: No sensory deficit.     Motor: No weakness.     Coordination: Coordination normal.     Gait: Gait normal.     Deep Tendon Reflexes: Reflexes normal.  Psychiatric:        Behavior: Behavior normal.      UC Treatments / Results  Labs (all labs ordered are listed, but only abnormal results are displayed) Labs Reviewed - No data to display  EKG   Radiology No results found.  Procedures Procedures (including critical care time)  Medications Ordered in UC Medications  ketorolac  (TORADOL ) 30 MG/ML injection 30 mg (has no administration in time range)  SUMAtriptan  (IMITREX ) injection 6 mg (has no administration in time range)    Initial Impression / Assessment and Plan / UC Course  I have reviewed the triage vital signs and the nursing notes.  Pertinent labs & imaging results that were available during my care of the patient were reviewed by me and considered in my medical decision making (see chart for details).   Toradol  and Imitrex  are given here and injection.  If there is worsening or no improvement, they will go to the emergency room Final Clinical Impressions(s) / UC Diagnoses   Final diagnoses:  Acute intractable headache, unspecified headache type     Discharge Instructions      You have been given a shot of Toradol  30 mg and of sumatriptan  hand 6 mg today.   If you do not improve at all with the treatments provided or if you worsen, please go to the emergency room     ED Prescriptions   None     PDMP not reviewed this encounter.   Vonna Sharlet POUR, MD 07/05/23 2002

## 2023-07-05 NOTE — ED Triage Notes (Signed)
 Pt states headache for the past 2 days.  States they took tylenol at home with no relief.

## 2023-08-19 ENCOUNTER — Encounter (HOSPITAL_BASED_OUTPATIENT_CLINIC_OR_DEPARTMENT_OTHER): Payer: Self-pay

## 2023-08-19 ENCOUNTER — Other Ambulatory Visit: Payer: Self-pay

## 2023-08-19 ENCOUNTER — Emergency Department (HOSPITAL_BASED_OUTPATIENT_CLINIC_OR_DEPARTMENT_OTHER)
Admission: EM | Admit: 2023-08-19 | Discharge: 2023-08-19 | Disposition: A | Discharge status: Home / Self Care | Payer: Medicaid Other | Attending: Emergency Medicine | Admitting: Emergency Medicine

## 2023-08-19 DIAGNOSIS — R1030 Lower abdominal pain, unspecified: Secondary | ICD-10-CM | POA: Diagnosis present

## 2023-08-19 DIAGNOSIS — R000 Tachycardia, unspecified: Secondary | ICD-10-CM | POA: Insufficient documentation

## 2023-08-19 DIAGNOSIS — K529 Noninfective gastroenteritis and colitis, unspecified: Secondary | ICD-10-CM | POA: Diagnosis not present

## 2023-08-19 LAB — CBC
HCT: 45.1 % (ref 39.0–52.0)
Hemoglobin: 15.4 g/dL (ref 13.0–17.0)
MCH: 31.1 pg (ref 26.0–34.0)
MCHC: 34.1 g/dL (ref 30.0–36.0)
MCV: 91.1 fL (ref 80.0–100.0)
Platelets: 240 10*3/uL (ref 150–400)
RBC: 4.95 MIL/uL (ref 4.22–5.81)
RDW: 12.8 % (ref 11.5–15.5)
WBC: 8 10*3/uL (ref 4.0–10.5)
nRBC: 0 % (ref 0.0–0.2)

## 2023-08-19 LAB — COMPREHENSIVE METABOLIC PANEL WITH GFR
ALT: 16 U/L (ref 0–44)
AST: 18 U/L (ref 15–41)
Albumin: 4.6 g/dL (ref 3.5–5.0)
Alkaline Phosphatase: 43 U/L (ref 38–126)
Anion gap: 9 (ref 5–15)
BUN: 17 mg/dL (ref 6–20)
CO2: 24 mmol/L (ref 22–32)
Calcium: 9.9 mg/dL (ref 8.9–10.3)
Chloride: 104 mmol/L (ref 98–111)
Creatinine, Ser: 0.93 mg/dL (ref 0.61–1.24)
GFR, Estimated: >60 mL/min
Glucose, Bld: 118 mg/dL — ABNORMAL HIGH (ref 70–99)
Potassium: 4 mmol/L (ref 3.5–5.1)
Sodium: 137 mmol/L (ref 135–145)
Total Bilirubin: 0.7 mg/dL (ref 0.0–1.2)
Total Protein: 8.4 g/dL — ABNORMAL HIGH (ref 6.5–8.1)

## 2023-08-19 LAB — RESP PANEL BY RT-PCR (RSV, FLU A&B, COVID)  RVPGX2
Influenza A by PCR: NEGATIVE
Influenza B by PCR: NEGATIVE
Resp Syncytial Virus by PCR: NEGATIVE
SARS Coronavirus 2 by RT PCR: NEGATIVE

## 2023-08-19 LAB — LIPASE, BLOOD: Lipase: 30 U/L (ref 11–51)

## 2023-08-19 MED ORDER — ONDANSETRON 4 MG PO TBDP: 4.0000 mg | ORAL_TABLET | Freq: Once | ORAL | Status: DC | PRN

## 2023-08-19 MED ORDER — ONDANSETRON 4 MG PO TBDP: 4.0000 mg | ORAL_TABLET | Freq: Three times a day (TID) | ORAL | 0 refills | Status: AC | PRN

## 2023-08-19 MED ORDER — ONDANSETRON HCL 4 MG/2ML IJ SOLN
4.0000 mg | Freq: Once | INTRAMUSCULAR | Status: AC
  Administered 2023-08-19: 4 mg via INTRAVENOUS
  Filled 2023-08-19: qty 2

## 2023-08-19 MED ORDER — KETOROLAC TROMETHAMINE 15 MG/ML IJ SOLN
15.0000 mg | Freq: Once | INTRAMUSCULAR | Status: AC
  Administered 2023-08-19: 15 mg via INTRAVENOUS
  Filled 2023-08-19: qty 1

## 2023-08-19 MED ORDER — SODIUM CHLORIDE 0.9 % IV BOLUS
1000.0000 mL | Freq: Once | INTRAVENOUS | Status: AC
  Administered 2023-08-19: 1000 mL via INTRAVENOUS

## 2023-08-19 NOTE — ED Notes (Signed)
Pt tolerated 3 cups of water without any complaints, no N/V reported.

## 2023-08-19 NOTE — Discharge Instructions (Addendum)
Be sure to increase your fluid intake.  Start up with a liquid diet and you can progress as tolerated by your abdominal symptoms.  You can use the nausea medicine provided and if you continue to have diarrhea you can take Imodium or Pepto-Bismol over-the-counter.  Use ibuprofen and/or Tylenol for pain.  If you develop worsening, continued, or recurrent abdominal pain, uncontrolled vomiting, fever, chest or back pain, or any other new/concerning symptoms then return to the ER for evaluation.

## 2023-08-19 NOTE — ED Provider Notes (Signed)
[address] EMERGENCY DEPARTMENT AT [facility] Provider Note   CSN: [id number] Arrival date & time: 08/19/23  1213     History  Chief Complaint  Patient presents with   Abdominal Pain    Victor Rogers is a 23 y.o. male.  HPI 23 year old presents with vomiting and diarrhea.  Symptoms started around 6 AM.  Has been unable to keep anything down.  No fevers but has felt hot.  No blood in the emesis or diarrhea.  Is also having a lot of lower abdominal pain.  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  No urinary symptoms.  Home Medications Prior to Admission medications   Medication Sig Start Date End Date Taking? Authorizing Provider  ondansetron (ZOFRAN-ODT) 4 MG disintegrating tablet Take 1 tablet (4 mg total) by mouth every 8 (eight) hours as needed for nausea or vomiting. 08/19/23  Yes Pricilla Loveless, MD  chlorhexidine (PERIDEX) 0.12 % solution Use as directed 15 mLs in the mouth or throat 2 (two) times daily. 11/09/21   Merrilee Jansky, MD  estradiol valerate (DELESTROGEN) 40 MG/ML injection  06/23/23   [provider]  ibuprofen (ADVIL) 600 MG tablet Take 1 tablet (600 mg total) by mouth every 6 (six) hours as needed. 05/08/21   Theadora Rama Scales, PA-C  spironolactone (ALDACTONE) 100 MG tablet Take 100 mg by mouth 2 (two) times daily. 06/23/23   [provider]      Allergies    Patient has no known allergies.    Review of Systems   Review of Systems  Constitutional:  Negative for fever.  Gastrointestinal:  Positive for abdominal pain, diarrhea, nausea and vomiting. Negative for blood in stool.    Physical Exam Updated Vital Signs BP 132/61   Pulse 82   Temp 99 F (37.2 C) (Oral)   Resp (!) 24   Ht 5\' 11"  (1.803 m)   Wt 97.1 kg   SpO2 100%   BMI 29.85 kg/m  Physical Exam Vitals and nursing note reviewed.  Constitutional:      Appearance: He is well-developed.  HENT:     Head:  Normocephalic and atraumatic.  Cardiovascular:     Rate and Rhythm: Regular rhythm. Tachycardia present.     Heart sounds: Normal heart sounds.     Comments: HR low 100s Pulmonary:     Effort: Pulmonary effort is normal.     Breath sounds: Normal breath sounds.  Abdominal:     Palpations: Abdomen is soft.     Tenderness: There is generalized abdominal tenderness.  Skin:    General: Skin is warm and dry.  Neurological:     Mental Status: He is alert.     ED Results / Procedures / Treatments   Labs (all labs ordered are listed, but only abnormal results are displayed) Labs Reviewed  COMPREHENSIVE METABOLIC PANEL - Abnormal; Notable for the following components:      Result Value   Glucose, Bld 118 (*)    Total Protein 8.4 (*)    All other components within normal limits  RESP PANEL BY RT-PCR (RSV, FLU A&B, COVID)  RVPGX2  LIPASE, BLOOD  CBC  URINALYSIS, ROUTINE W REFLEX MICROSCOPIC    EKG None  Radiology No results found.  Procedures Procedures    Medications Ordered in ED Medications  sodium chloride 0.9 % bolus 1,000 mL (1,000 mLs Intravenous New Bag/Given 08/19/23 1415)  ondansetron (ZOFRAN) injection 4 mg (4 mg Intravenous  Given 08/19/23 1415)  ketorolac (TORADOL) 15 MG/ML injection 15 mg (15 mg Intravenous Given 08/19/23 1416)    ED Course/ Medical Decision Making/ A&P                                 Medical Decision Making Amount and/or Complexity of Data Reviewed Labs: ordered.    Details: Normal COVID/flu/influenza test.  Unremarkable electrolytes.  Normal WBC.  Risk Prescription drug management.   Patient arrived tachycardic but after treatment and fluids, vital signs have normalized.  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  Reports abdominal pain but on abdominal exam when distracted there is no tenderness and the abdomen is soft.  Low suspicion for acute intra-abdominal emergency such as  appendicitis, diverticulitis, obstruction, etc.  I do not think CT imaging or imaging at all is needed.  Patient is tolerating oral fluids.  Will discharge home with return precautions and supportive care.        Final Clinical Impression(s) / ED Diagnoses Final diagnoses:  Acute gastroenteritis    Rx / DC Orders ED Discharge Orders          Ordered    ondansetron (ZOFRAN-ODT) 4 MG disintegrating tablet  Every 8 hours PRN        08/19/23 1441              Pricilla Loveless, MD 08/19/23 1444

## 2023-08-19 NOTE — ED Triage Notes (Signed)
Last night began with nausea and vomiting.  Pain through out lower abd.  Diarrhea

## 2023-08-19 NOTE — ED Triage Notes (Signed)
Pt arrived via [facility] from home c/o N/V/D and abd pain that started this morning at approx 0600.   EMS VS  BP 134/84 HR 108  SpO2 99% Temp 97.2 F

## 2023-10-06 NOTE — Progress Notes (Signed)
 Subjective  Patient ID: Victor Rogers is a 23 y.o. adult.   HPI  Referred for consultation gender affirming surgery.  Patient has identified as male with preferred name Victor Rogers in all aspects of life for three years including work and family. Patient has been on hormonal therapy managed by [facility] for 3 years. Reports breast growth during this time to B cup.   No prior MMG. Denies FH breast or ovarian ca.  Vaping with nicotine.  Lives alone. 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 Mother sister and GM in area to assist with post op care. Reports family is supportive.  Review of Systems  All other systems reviewed and are negative.   Objective  Physical Exam  Cardiovascular: Normal rate.   Pulmonary/Chest Effort normal.   Skin  Fitzpatrick 6   Psychiatric She has a normal mood and affect.   Lymph: no palpable axillary adenopathy  Breasts: no palpable masses, no ptosis SN to nipple R 20.5 l 21 cm BW R 16 L 16 cm Nipple to IMF R 6 L 6 cm Soft tissue pinch 4 cm  Assessment/Plan  Diagnoses and all orders for this visit:  Gender dysphoria   Needs to be off all nicotine products for 4-6 w prior to surgery. Reviewed WPATH guidelines. Provided names local providers in behavioral health.   Patient has identified and lived as male for 3 years in all aspects of her life and completed hormonal therapy for 3 years. She appears to be good candidate for augmentation and good understanding of risks of procedure.    Discussed possible incisions, dual plane vs subglandular vs subfascial positions. Explained by preference for IMF incision and subfascial placement. Discussed saline vs silicone, imaging surveillance of latter, rippling that may be more severe with saline implants, risks rupture, capsular contracture, infection requiring removal. Discussed post procedure  pain, recovery.  Discussed length of surgery, possible complications of the surgery,  and the post surgical limitations and alternatives of surgical approaches to augmentation. She is agreeable to IMF incisions and subfascial placement.  Patient sized today and liked 500 cc. Counseled cannot assure her cup size.   Discussed with patient there is no specific consensus for breast cancer screening for transgender women. Our local radiologists recommend annual or biannual mammography beginning age 10 for transgender women who have taken estrogen treatment for least 5 years.  Patient leaning toward saline implants. Provided Natrelle Saline implant document.   Pictures taken. Await letter from behavioral health.

## 2023-12-24 ENCOUNTER — Telehealth: Admitting: Family Medicine

## 2023-12-24 DIAGNOSIS — J111 Influenza due to unidentified influenza virus with other respiratory manifestations: Secondary | ICD-10-CM | POA: Diagnosis not present

## 2023-12-24 NOTE — Patient Instructions (Signed)

## 2023-12-24 NOTE — Progress Notes (Signed)
 Virtual Visit Consent   Victor Rogers, you are scheduled for a virtual visit with a [facility] provider today. Just as with appointments in the office, your consent must be obtained to participate. Your consent will be active for this visit and any virtual visit you may have with one of our providers in the next 365 days. If you have a MyChart account, a copy of this consent can be sent to you electronically.  As this is a virtual visit, video technology does not allow for your provider to perform a traditional examination. This may limit your provider's ability to fully assess your condition. If your provider identifies any concerns that need to be evaluated in person or the need to arrange testing (such as labs, EKG, etc.), we will make arrangements to do so. Although advances in technology are sophisticated, we cannot ensure that it will always work on either your end or our end. If the connection with a video visit is poor, the visit may have to be switched to a telephone visit. With either a video or telephone visit, we are not always able to ensure that we have a secure connection.  By engaging in this virtual visit, you consent to the provision of healthcare and authorize for your insurance to be billed (if applicable) for the services provided during this visit. Depending on your insurance coverage, you may receive a charge related to this service.  I need to obtain your verbal consent now. Are you willing to proceed with your visit today? Victor Rogers has provided verbal consent on 12/24/2023 for a virtual visit (video or telephone). Victor Lamp, FNP  Date: 12/24/2023 2:05 PM   Virtual Visit via Video Note   I, Victor Rogers, connected with  Victor Rogers  ([id number], 05-22-2001) on 12/24/23 at  2:00 PM EDT by a video-enabled telemedicine application and verified that I am speaking with the correct person using two identifiers.  Location: Patient: Virtual Visit Location Patient:  Home Provider: Virtual Visit Location Provider: Home Office   I discussed the limitations of evaluation and management by telemedicine and the availability of in person appointments. The patient expressed understanding and agreed to proceed.    History of Present Illness: Victor Rogers is a 24 y.o. who identifies as a male who was assigned male at birth, and is being seen today for a work note. Dx with flu yesterday at a clinic. Started on tamiflu but needs a work note for today. SABRA  HPI: HPI  Problems: There are no active problems to display for this patient.   Allergies: No Known Allergies Medications:  Current Outpatient Medications:    chlorhexidine  (PERIDEX ) 0.12 % solution, Use as directed 15 mLs in the mouth or throat 2 (two) times daily., Disp: 120 mL, Rfl: 0   estradiol valerate (DELESTROGEN) 40 MG/ML injection, , Disp: , Rfl:    ibuprofen  (ADVIL ) 600 MG tablet, Take 1 tablet (600 mg total) by mouth every 6 (six) hours as needed., Disp: 30 tablet, Rfl: 0   ondansetron  (ZOFRAN -ODT) 4 MG disintegrating tablet, Take 1 tablet (4 mg total) by mouth every 8 (eight) hours as needed for nausea or vomiting., Disp: 10 tablet, Rfl: 0   spironolactone (ALDACTONE) 100 MG tablet, Take 100 mg by mouth 2 (two) times daily., Disp: , Rfl:   Observations/Objective: Patient is well-developed, well-nourished in no acute distress.  Resting comfortably  at home.  Head is normocephalic, atraumatic.  No labored breathing.  Speech is clear and coherent with logical  content.  Patient is alert and oriented at baseline.    Assessment and Plan: 1. Influenza (Primary)  See influenza education, UC as needed.  Follow Up Instructions: I discussed the assessment and treatment plan with the patient. The patient was provided an opportunity to ask questions and all were answered. The patient agreed with the plan and demonstrated an understanding of the instructions.  A copy of instructions were sent to the  patient via MyChart unless otherwise noted below.     The patient was advised to call back or seek an in-person evaluation if the symptoms worsen or if the condition fails to improve as anticipated.    Karas Pickerill, FNP

## 2024-04-05 NOTE — Telephone Encounter (Addendum)
 2nd attempt to reach pt regarding referral for [facility]. Pt was at work at the time of the call and asked me to call back. Will follow-up at a later time.

## 2024-04-13 ENCOUNTER — Emergency Department (HOSPITAL_COMMUNITY)
Admission: EM | Admit: 2024-04-13 | Discharge: 2024-04-13 | Disposition: A | Discharge status: Home / Self Care | Attending: Emergency Medicine | Admitting: Emergency Medicine

## 2024-04-13 ENCOUNTER — Other Ambulatory Visit: Payer: Self-pay

## 2024-04-13 DIAGNOSIS — Z113 Encounter for screening for infections with a predominantly sexual mode of transmission: Secondary | ICD-10-CM | POA: Insufficient documentation

## 2024-04-13 DIAGNOSIS — R21 Rash and other nonspecific skin eruption: Secondary | ICD-10-CM | POA: Insufficient documentation

## 2024-04-13 LAB — URINALYSIS, ROUTINE W REFLEX MICROSCOPIC
Bilirubin Urine: NEGATIVE
Glucose, UA: NEGATIVE mg/dL
Hgb urine dipstick: NEGATIVE
Ketones, ur: NEGATIVE mg/dL
Leukocytes,Ua: NEGATIVE
Nitrite: NEGATIVE
Protein, ur: NEGATIVE mg/dL
Specific Gravity, Urine: 1.029 (ref 1.005–1.030)
pH: 5 (ref 5.0–8.0)

## 2024-04-13 LAB — RPR
RPR Ser Ql: REACTIVE — AB
RPR Titer: 1:128 {titer}

## 2024-04-13 LAB — HIV ANTIBODY (ROUTINE TESTING W REFLEX): HIV Screen 4th Generation wRfx: NONREACTIVE

## 2024-04-13 MED ORDER — LIDOCAINE HCL (PF) 1 % IJ SOLN
INTRAMUSCULAR | Status: AC
  Administered 2024-04-13: 1.2 mL
  Filled 2024-04-13: qty 5

## 2024-04-13 MED ORDER — HYDROCORTISONE 1 % EX CREA: TOPICAL_CREAM | CUTANEOUS | 0 refills | Status: AC

## 2024-04-13 MED ORDER — AZITHROMYCIN 250 MG PO TABS
1000.0000 mg | ORAL_TABLET | Freq: Once | ORAL | Status: AC
  Administered 2024-04-13: 1000 mg via ORAL
  Filled 2024-04-13: qty 4

## 2024-04-13 MED ORDER — CEFTRIAXONE SODIUM 500 MG IJ SOLR
500.0000 mg | Freq: Once | INTRAMUSCULAR | Status: AC
  Administered 2024-04-13: 500 mg via INTRAMUSCULAR
  Filled 2024-04-13: qty 500

## 2024-04-13 MED ORDER — LIDOCAINE HCL (PF) 1 % IJ SOLN: 1.0000 mL | Freq: Once | INTRAMUSCULAR | Status: AC

## 2024-04-13 NOTE — ED Triage Notes (Signed)
 Itchy skin rashes to face /upper chest this evening , unknown etiology , airway intact / no oral swelling /respirations unlabored.

## 2024-04-13 NOTE — Discharge Instructions (Addendum)
 You were seen in the emergency room for rash over your neck, and also requested screening for STD.  We are prescribing you topical hydrocortisone for what seems like possibly an allergic type rash or contact dermatitis.  Consider taking Benadryl for itching.  If the rash starts spreading, please return to the emergency room for further assessment.

## 2024-04-13 NOTE — ED Provider Notes (Signed)
 [address] EMERGENCY DEPARTMENT AT [facility] Provider Note   CSN: [id number] Arrival date & time: 04/13/24  0321     Patient presents with: Rash   Victor Rogers is a 23 y.o. male.   HPI    23 year old transitioning woman comes in with chief complaint of rash.  Patient states that her friend noticed the rash prior to ED arrival over the neck.  It was itching initially, but currently not doing so.  Also states that she has make-up on her face right now, but had seen a rash over the chin that has her concerned for syphilis. She would like to be screened STI.   Rash currently not itchy.  Located only in the neck.  Prior to Admission medications   Medication Sig Start Date End Date Taking? Authorizing Provider  hydrocortisone cream 1 % Apply to affected area 2 times daily 04/13/24  Yes Lujuana Kapler, MD  chlorhexidine  (PERIDEX ) 0.12 % solution Use as directed 15 mLs in the mouth or throat 2 (two) times daily. 11/09/21   Blaise Aleene KIDD, MD  estradiol valerate (DELESTROGEN) 40 MG/ML injection  06/23/23   [provider]  ibuprofen  (ADVIL ) 600 MG tablet Take 1 tablet (600 mg total) by mouth every 6 (six) hours as needed. 05/08/21   Joesph Shaver Scales, PA-C  ondansetron  (ZOFRAN -ODT) 4 MG disintegrating tablet Take 1 tablet (4 mg total) by mouth every 8 (eight) hours as needed for nausea or vomiting. 08/19/23   Freddi Hamilton, MD  spironolactone (ALDACTONE) 100 MG tablet Take 100 mg by mouth 2 (two) times daily. 06/23/23   [provider]    Allergies: Patient has no known allergies.    Review of Systems  All other systems reviewed and are negative.   Updated Vital Signs BP (!) 150/86 (BP Location: Right Arm)   Pulse (!) 105   Temp 98.8 F (37.1 C)   Resp 16   SpO2 99%   Physical Exam Vitals and nursing note reviewed.  Constitutional:      Appearance: He is well-developed.  HENT:     Head: Atraumatic.  Cardiovascular:     Rate and  Rhythm: Normal rate.  Pulmonary:     Effort: Pulmonary effort is normal.  Musculoskeletal:     Cervical back: Neck supple.  Skin:    General: Skin is warm.     Findings: Erythema and rash present.     Comments: Macular rash over both sides of the neck laterally, just above the clavicular region.  Confluence of 6-7 lesions on both sides.  No pustules, vesicles, ulcers, drainage  Neurological:     Mental Status: He is alert and oriented to person, place, and time.     (all labs ordered are listed, but only abnormal results are displayed) Labs Reviewed  RPR  URINALYSIS, ROUTINE W REFLEX MICROSCOPIC  HIV ANTIBODY (ROUTINE TESTING W REFLEX)  GC/CHLAMYDIA PROBE AMP ([place]) NOT AT [facility]    EKG: None  Radiology: No results found.   Procedures   Medications Ordered in the ED  cefTRIAXone  (ROCEPHIN ) injection 500 mg (has no administration in time range)  lidocaine  (PF) (XYLOCAINE ) 1 % injection 1-2.1 mL (has no administration in time range)  azithromycin (ZITHROMAX) tablet 1,000 mg (has no administration in time range)  lidocaine  (PF) (XYLOCAINE ) 1 % injection (has no administration in time range)  Medical Decision Making Amount and/or Complexity of Data Reviewed Labs: ordered.  Risk Prescription drug management.   23 year old patient comes in with chief complaint of rash.  Patient also wants STD screening.  The rash looks pretty benign.  Flat, confluence of macular, erythematous lesions without any signs of vesicles, pustules, ulcers.  Lesions were just noted prior to ED arrival.  Itchy initially but currently not.  Patient also wants STD screen, as she had some rash on the face that was to her concerning for syphilis.   Patient wants empiric treatment for GC and chlamydia as well, but denies any burning with urination, discharge or genitalia rash.  States that she has had some high risk encounters and has had previous STI.  We will get  urine GC and chlamydia.  Patient will get IV ceftriaxone  and azithromycin here along with prescription for topical hydrocortisone cream.  [facility] department information provided.  Patient has agreed and consented for syphilis and HIV screen here.  Final diagnoses:  Rash  Screen for STD (sexually transmitted disease)    ED Discharge Orders          Ordered    hydrocortisone cream 1 %        04/13/24 0758               Charlyn Sora, MD 04/13/24 [phone]

## 2024-04-13 NOTE — ED Triage Notes (Addendum)
 Pt reports itching and rash to chest and face to unknown substance, no hx of same, no meds PTA, pt speaking in full sentences, speech clear, no distress noted

## 2024-04-16 LAB — T.PALLIDUM AB, TOTAL: T Pallidum Abs: REACTIVE — AB

## 2024-04-17 ENCOUNTER — Ambulatory Visit (HOSPITAL_COMMUNITY): Payer: Self-pay
